# Patient Record
Sex: Female | Born: 1935 | Race: Black or African American | Hispanic: No
Health system: Southern US, Community
[De-identification: ages and names within clinical notes are randomized; demographics above are authoritative.]

---

## 2004-08-19 ENCOUNTER — Inpatient Hospital Stay (HOSPITAL_COMMUNITY): Admission: EM | Admit: 2004-08-19 | Discharge: 2004-08-27 | Payer: Self-pay | Admitting: *Deleted

## 2004-08-19 ENCOUNTER — Ambulatory Visit: Payer: Self-pay | Admitting: Sports Medicine

## 2004-09-06 ENCOUNTER — Encounter (HOSPITAL_COMMUNITY): Admission: RE | Admit: 2004-09-06 | Discharge: 2004-12-05 | Payer: Self-pay | Admitting: *Deleted

## 2004-09-30 ENCOUNTER — Inpatient Hospital Stay (HOSPITAL_COMMUNITY): Admission: EM | Admit: 2004-09-30 | Discharge: 2004-10-05 | Payer: Self-pay | Admitting: Emergency Medicine

## 2004-12-31 ENCOUNTER — Inpatient Hospital Stay (HOSPITAL_COMMUNITY): Admission: EM | Admit: 2004-12-31 | Discharge: 2005-01-10 | Payer: Self-pay | Admitting: Emergency Medicine

## 2005-01-16 ENCOUNTER — Inpatient Hospital Stay (HOSPITAL_COMMUNITY): Admission: EM | Admit: 2005-01-16 | Discharge: 2005-01-20 | Payer: Self-pay | Admitting: Emergency Medicine

## 2005-10-13 ENCOUNTER — Inpatient Hospital Stay (HOSPITAL_COMMUNITY): Admission: RE | Admit: 2005-10-13 | Discharge: 2005-10-18 | Payer: Self-pay | Admitting: Cardiology

## 2006-06-22 ENCOUNTER — Inpatient Hospital Stay (HOSPITAL_COMMUNITY): Admission: EM | Admit: 2006-06-22 | Discharge: 2006-06-24 | Payer: Self-pay | Admitting: Emergency Medicine

## 2006-06-26 IMAGING — CR DG CHEST 1V PORT
1 series · 1 of 1 positions shown · non-contrast
Comparison: 08/19/04.

CLINICAL DATA: Short of breath and confused.
 PORTABLE CHEST - 08/20/04 AT 0777 HOURS

[view not recorded]
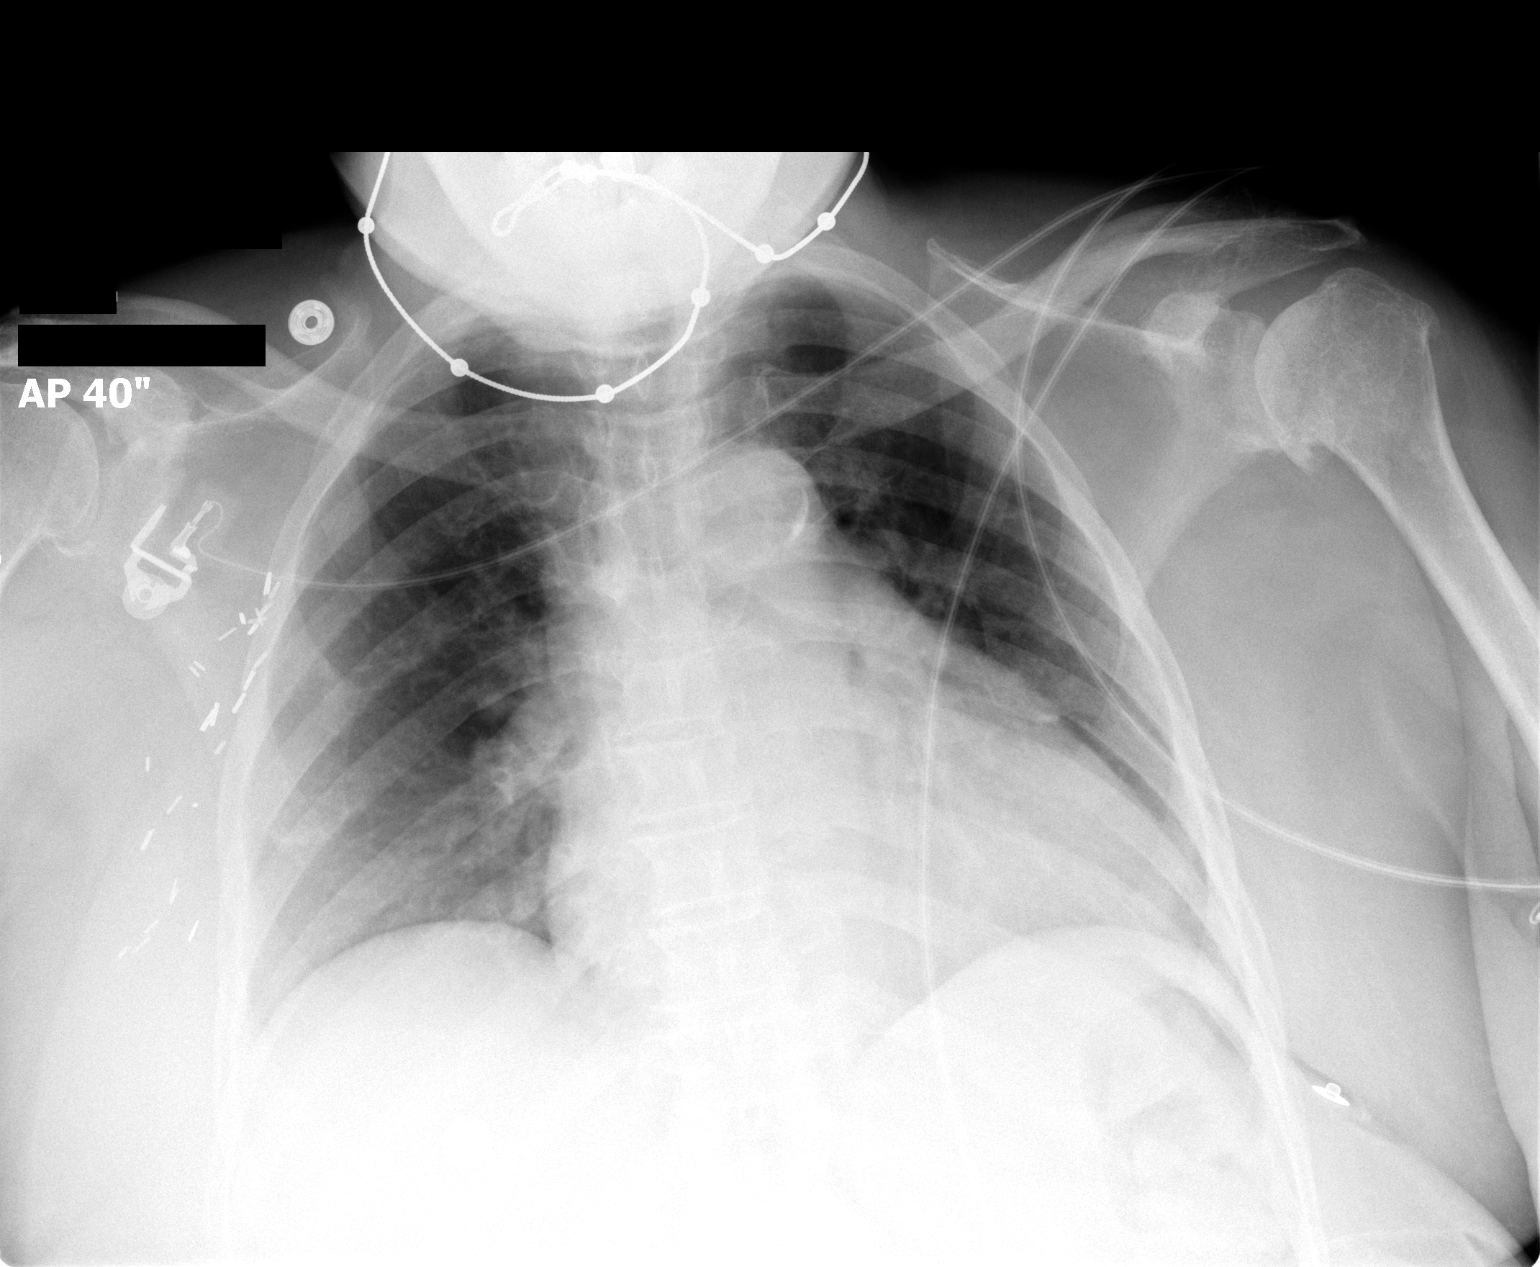

[1 of 1 positions shown; findings below may reference images not displayed]

Stable enlarged cardiac silhouette.  No significant change in mild diffuse peribronchial thickening.  Improved inspiration.  Stable right axillary surgical clips and bilateral shoulder degenerative changes.
 IMPRESSION
 Stable cardiomegaly and mild bronchitic changes.  No acute abnormality.

## 2007-08-19 IMAGING — CT CT ABDOMEN W/O CM
2 of 3 series · 12 of 36 positions shown, 19 images · non-contrast
Comparison: none

CLINICAL DATA: Abdominal pain after catheterization.  Evaluate for retroperitoneal hemorrhage.
 ABDOMEN CT WITHOUT CONTRAST ? 10/13/05:
TECHNIQUE: Multidetector CT imaging of the abdomen was performed following the standard protocol without IV contrast.
TECHNIQUE: Multidetector CT imaging of the pelvis was performed following the standard protocol without IV contrast.

[Series 2: routine abdomen · axial · 0.73mm/px · z∈[-352,+18]mm · 11 of 90 slices shown, 17 images]
[im 8/90  soft-tissue]
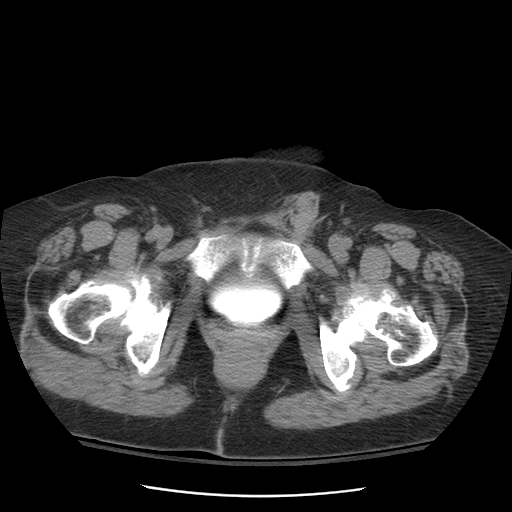
[im 8/90  bone]
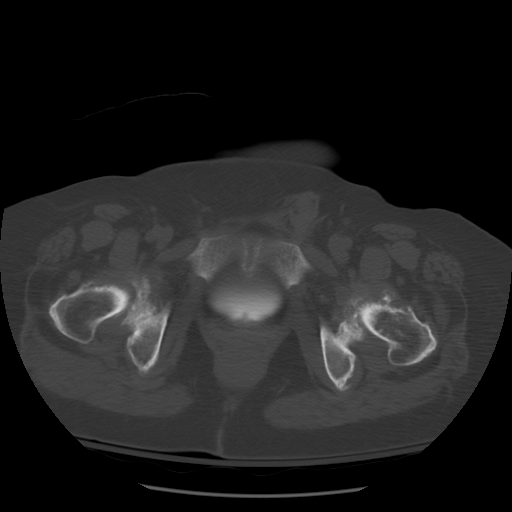
[im 15/90  soft-tissue]
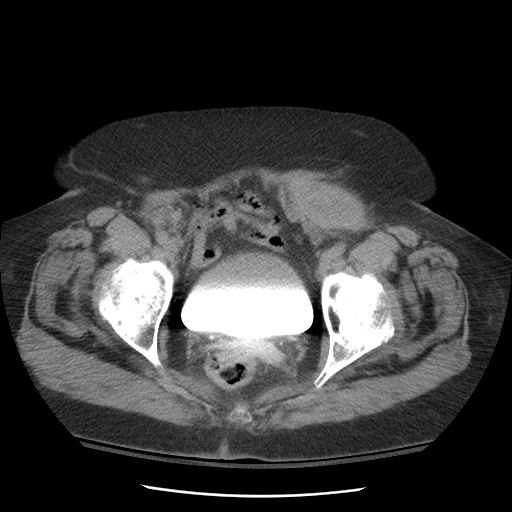
[im 23/90  soft-tissue]
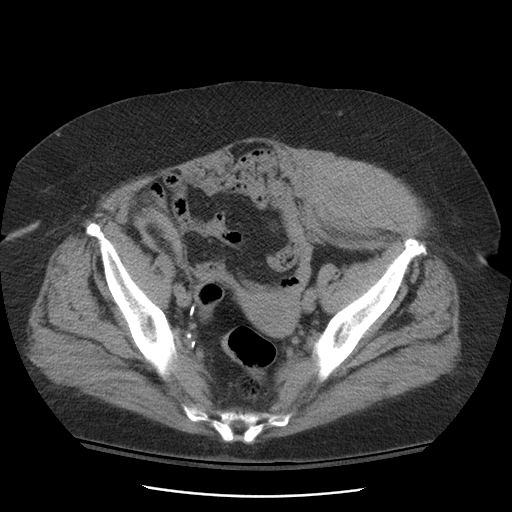
[im 30/90  soft-tissue]
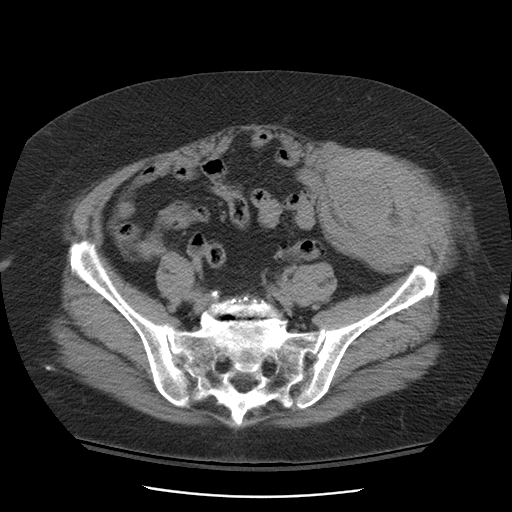
[im 38/90  soft-tissue]
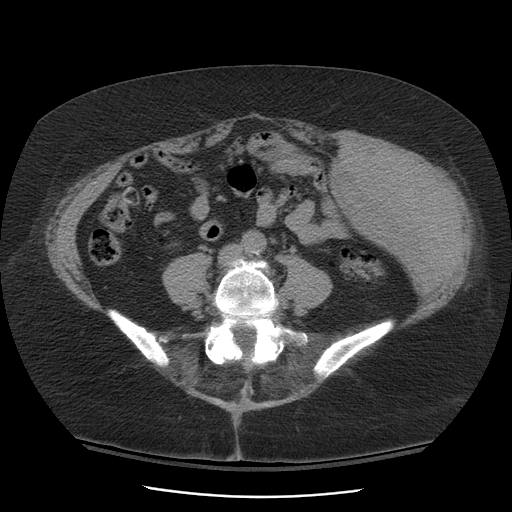
[im 45/90  soft-tissue]
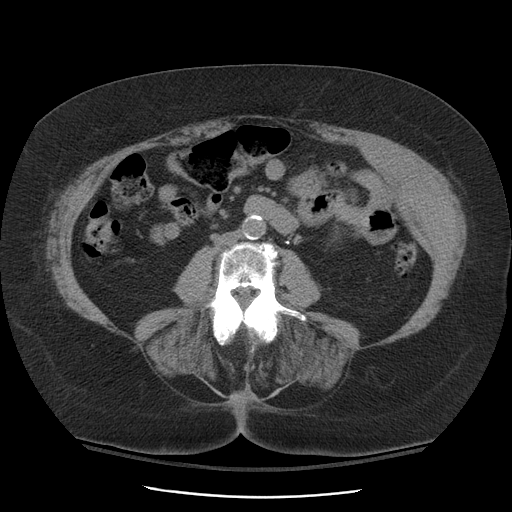
[im 52/90  soft-tissue]
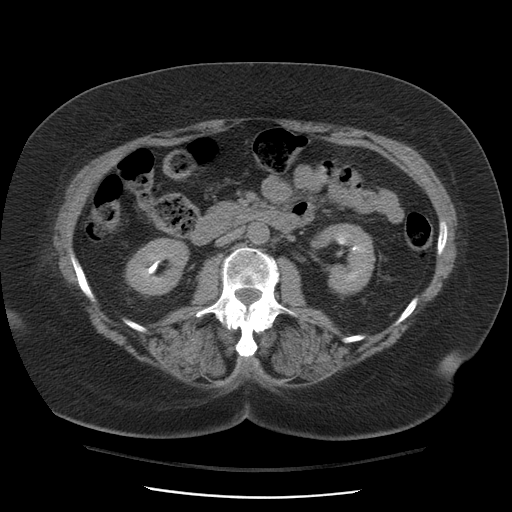
[im 60/90  soft-tissue]
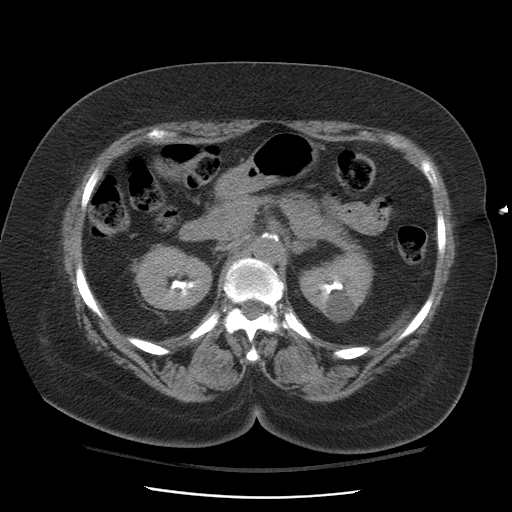
[im 60/90  lung]
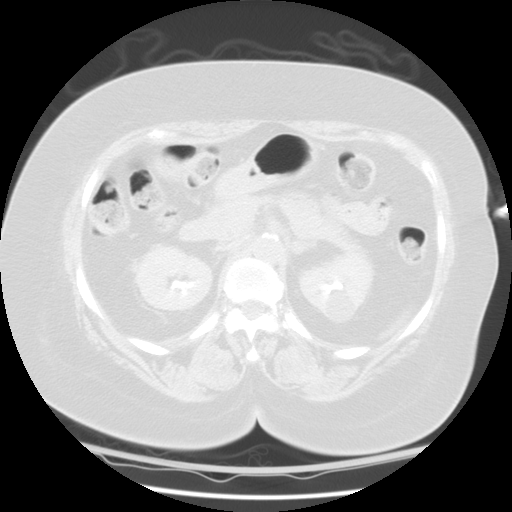
[im 67/90  soft-tissue]
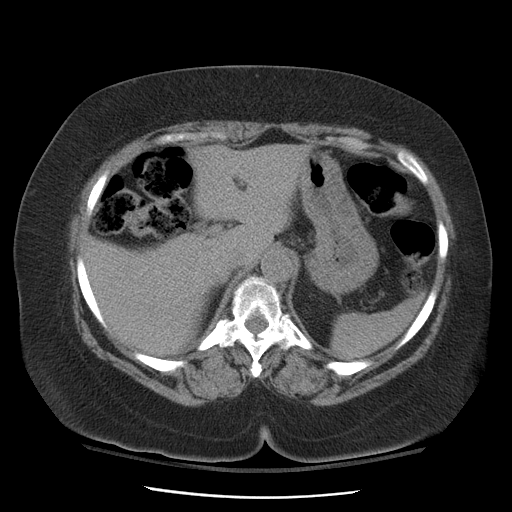
[im 67/90  lung]
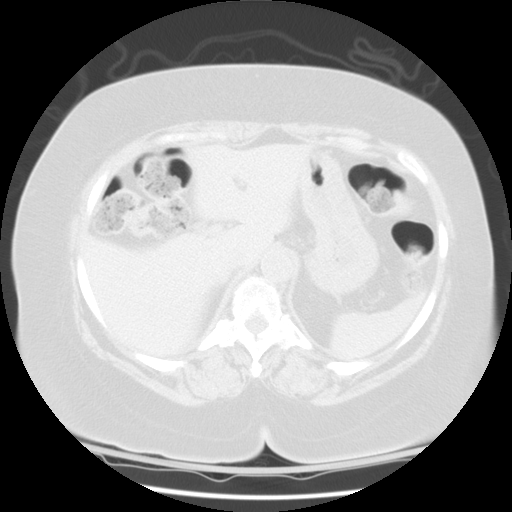
[im 67/90  bone]
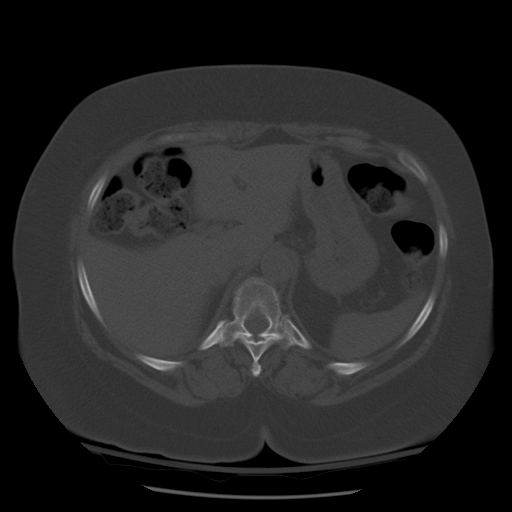
[im 75/90  soft-tissue]
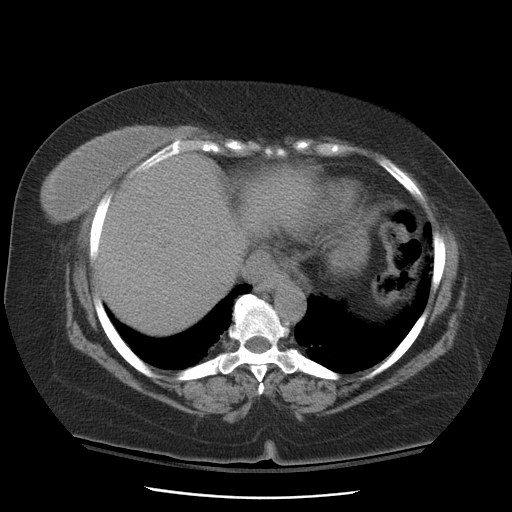
[im 75/90  lung]
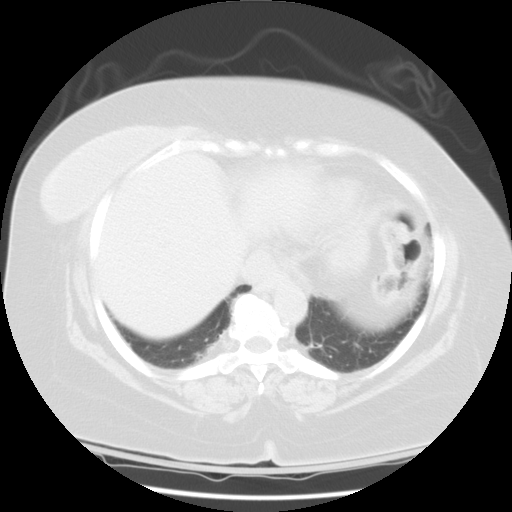
[im 82/90  soft-tissue]
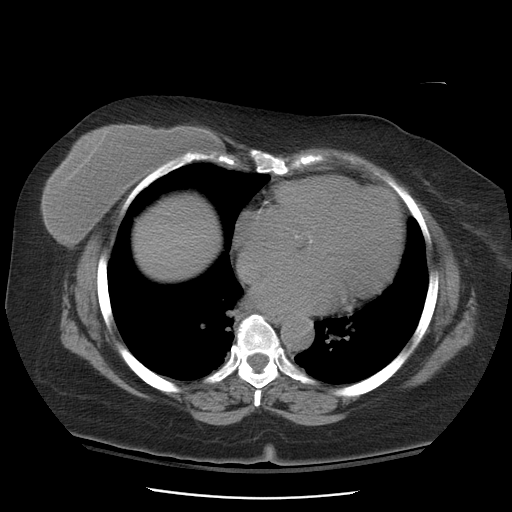
[im 82/90  lung]
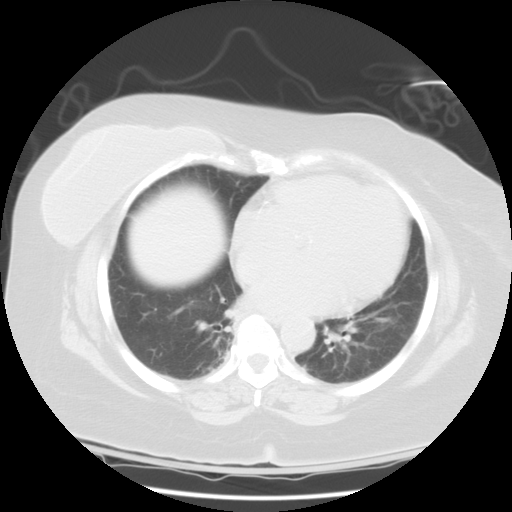

[Series 104: reformatted · sagittal · 0.78mm/px · 1 of 168 slices shown, 2 images]
[im 56/168  soft-tissue]
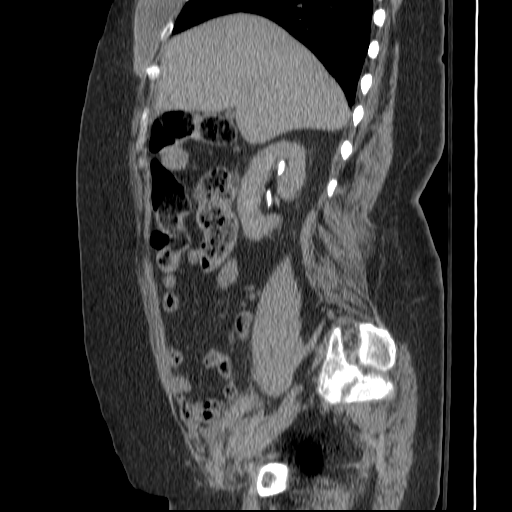
[im 56/168  bone]
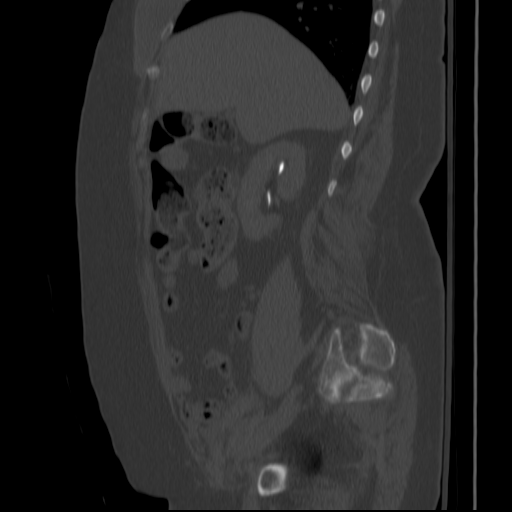

[12 of 36 positions shown; findings below may reference images not displayed]

FINDINGS: No focal abnormality is seen in the liver or spleen although the lack of intravenous contrast lessens sensitivity for evaluating the solid abdominal viscera.  The stomach, duodenum, pancreas, gallbladder, adrenal glands, and abdominal bowel loops are unremarkable.  There is contrast excretion from the kidneys consistent with the patient?s history of recent catheterization.  A 2.2 cm cyst is noted in the upper pole of the left kidney with a 2.1 cm cyst noted in the lower pole of the right kidney.  No evidence for abdominal aortic aneurysm.  No intraperitoneal free fluid.  No abdominal lymphadenopathy.
IMPRESSION: Bilateral renal cysts.
 PELVIS CT WITHOUT CONTRAST ? 10/13/05:
FINDINGS: There is a large hematoma in the left lateral wall musculature measuring approximately 11.8 x 8.3 x 21.6 cm.  There is no evidence for extension into the left pelvic sidewall or retroperitoneal space.  
 There is stranding in the right inguinal region suggesting catheter placement on this side as well.  Diverticular change characterizes the left colon without evidence for diverticulitis.  Degenerative change is noted in both hips. Surgical changes are seen in the lower lumbar spine.
IMPRESSION: 1.  Large left lateral abdominal wall hematoma.
 2.  Stranding in the right inguinal region suggests catheter placement in that region as well.

## 2009-04-11 DEATH — deceased

## 2015-03-04 NOTE — Care Management Note (Signed)
    Page 1 of 1   03/04/2015     1:00:39 PM CARE MANAGEMENT NOTE 03/04/2015  Patient:  Bennetta LaosWILSON,Peggi   Account Number:  [id number]  Date Initiated:    Documentation initiated by:    Subjective/Objective Assessment:     Action/Plan:   Anticipated DC Date:     Anticipated DC Plan:           Choice offered to / List presented to:             Status of service:   Medicare Important Message given?   (If response is "NO", the following Medicare IM given date fields will be blank) Date Medicare IM given:   Medicare IM given by:   Date Additional Medicare IM given:   Additional Medicare IM given by:    Discharge Disposition:    Per UR Regulation:    If discussed at Long Length of Stay Meetings, dates discussed:    Comments:
